# Patient Record
Sex: Male | Born: 2000 | Race: White | Hispanic: No | Marital: Single | Smoking: Never smoker
Health system: Southern US, Community
[De-identification: ages and names within clinical notes are randomized; demographics above are authoritative.]

## PROBLEM LIST (undated history)

## (undated) DIAGNOSIS — F909 Attention-deficit hyperactivity disorder, unspecified type: Secondary | ICD-10-CM

## (undated) DIAGNOSIS — F902 Attention-deficit hyperactivity disorder, combined type: Principal | ICD-10-CM

## (undated) DIAGNOSIS — R278 Other lack of coordination: Secondary | ICD-10-CM

## (undated) HISTORY — PX: PATENT DUCTUS ARTERIOUS REPAIR: SHX269

## (undated) HISTORY — DX: Attention-deficit hyperactivity disorder, combined type: F90.2

## (undated) HISTORY — PX: EYE SURGERY: SHX253

## (undated) HISTORY — DX: Other lack of coordination: R27.8

---
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
## 2022-01-02 ENCOUNTER — Telehealth: Payer: Self-pay | Admitting: Pediatrics

## 2022-01-02 NOTE — Telephone Encounter (Signed)
Called Mom to reschedule an appointment and she stated Brandon House needed a refill on RITALIN) 20 MG .[address] [place] [address]  Phone:  [phone].

## 2022-01-03 MED ORDER — METHYLPHENIDATE HCL 20 MG PO TABS: 20.0000 mg | ORAL_TABLET | Freq: Every day | ORAL | 0 refills | Status: DC | PRN

## 2022-01-03 NOTE — Telephone Encounter (Signed)
RX for above e-scribed and sent to pharmacy on record  [facility] - [place], [place] - [address] [place] [address] Phone: [phone] Fax: [phone]  

## 2022-01-24 ENCOUNTER — Other Ambulatory Visit: Payer: Self-pay

## 2022-01-24 ENCOUNTER — Encounter: Payer: 59 | Admitting: Pediatrics

## 2022-01-24 MED ORDER — METHYLPHENIDATE HCL 20 MG PO TABS: 20.0000 mg | ORAL_TABLET | Freq: Every day | ORAL | 0 refills | Status: DC | PRN

## 2022-01-24 MED ORDER — METHYLPHENIDATE HCL ER (OSM) 54 MG PO TBCR: 54.0000 mg | EXTENDED_RELEASE_TABLET | ORAL | 0 refills | Status: DC

## 2022-01-24 NOTE — Telephone Encounter (Signed)
Concerta 54 mg daily, # 30 with no RF's and Ritalin 20 mg PRN in the evening, # 30 with no RF's .RX for above e-scribed and sent to pharmacy on record ? ?[facility] - [place], [place] - [address] ?[address] ?[place] [address] ?Phone: [phone] Fax: [phone] ? ? ? ?

## 2022-02-02 ENCOUNTER — Encounter: Payer: Self-pay | Admitting: Pediatrics

## 2022-02-02 ENCOUNTER — Telehealth (INDEPENDENT_AMBULATORY_CARE_PROVIDER_SITE_OTHER): Payer: 59 | Admitting: Pediatrics

## 2022-02-02 DIAGNOSIS — Z719 Counseling, unspecified: Secondary | ICD-10-CM

## 2022-02-02 DIAGNOSIS — R278 Other lack of coordination: Secondary | ICD-10-CM

## 2022-02-02 DIAGNOSIS — Z79899 Other long term (current) drug therapy: Secondary | ICD-10-CM

## 2022-02-02 DIAGNOSIS — F902 Attention-deficit hyperactivity disorder, combined type: Secondary | ICD-10-CM

## 2022-02-02 DIAGNOSIS — Z7189 Other specified counseling: Secondary | ICD-10-CM

## 2022-02-02 MED ORDER — METHYLPHENIDATE HCL 20 MG PO TABS: 20.0000 mg | ORAL_TABLET | Freq: Every day | ORAL | 0 refills | Status: DC | PRN

## 2022-02-02 MED ORDER — METHYLPHENIDATE HCL ER (OSM) 54 MG PO TBCR: 54.0000 mg | EXTENDED_RELEASE_TABLET | ORAL | 0 refills | Status: DC

## 2022-02-02 NOTE — Progress Notes (Signed)
?[facility] ?[facility] ?[address] ?[place] [address] ?Dept: [phone] ?Dept Fax: [phone] ? ?Medication Check by Caregility due to COVID-19 ? ?Patient ID:  Jonte Shiller  male DOB: 07/19/2001   21 y.o.   MRN: [id number]  ? ?DATE:02/02/22 ? ?PCP: [name], PA ? ?Interviewed: Marnette Burgess and Father  Name: Holland Falling  ?Location: Their home ?Provider location: [facility] office ? ?Virtual Visit via Video Note ?Connected with Marnette Burgess on 02/02/22 at  2:00 PM EDT by video enabled telemedicine application and verified that I am speaking with the correct person using two identifiers.   ?  ?I discussed the limitations, risks, security and privacy concerns of performing an evaluation and management service by telephone and the availability of in person appointments. I also discussed with the parent/patient that there may be a patient responsible charge related to this service. The parent/patient expressed understanding and agreed to proceed. ? ?HISTORY OF PRESENT ILLNESS/CURRENT STATUS: ?Adel Burch is being followed for medication management for ADHD, dysgraphia and learning differences.   ?Last visit on 10/23/2021 ? ?Tammy currently prescribed Concerta 54 mg every morning with Ritalin 20 mg as needed ? ?Behaviors: Excellent behaviors at home, at school and in work settings ? ?Eating well (eating breakfast, lunch and dinner).  ? ?Elimination: No concerns ? ?Sleeping: No concerns and sleeping through the night.  ? 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 ?[employer] free will do the [employer] - starts in July ?Working at [employer] - usually on weekends ?Screen time:  (phone, tablet, TV, computer): non-essential, no essential ? ?MEDICAL HISTORY: ?Individual Medical History/ Review of Systems: Changes? :No ? ?Family Medical/ Social History: Changes? No   ?Patient Lives with: parents ? ?MENTAL HEALTH: ?Denies sadness, loneliness or depression.  ?Denies self harm or thoughts of self harm or injury. ?Denies fears, worries and anxieties. ?Has good peer relations and is not a bully nor is victimized. ? ? ?ASSESSMENT:  ?Scot is 21-years of age with a diagnosis of ADHD/dysgraphia that is improved and well controlled with current medication.  No medication changes at this time. ?We will plan a 40-month follow-up over the summer and discussed transition of services for college students at that time.  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 ?I do recommend continued screen time reduction and daily physical activities.  Maintain good sleep routines avoiding late nights.  Protein rich food avoiding junk and empty calories. ?Overall stable ADHD with medication management ?Has appropriate school accommodations with progress academically ? ?DIAGNOSES:  ?  ICD-10-CM   ?1. ADHD (attention deficit hyperactivity disorder), combined type  F90.2   ?  ?2. Dysgraphia  R27.8   ?  ?3. Medication management  Z79.899   ?  ?4. Patient counseled  Z71.9   ?  ?5. Parenting dynamics counseling  Z71.89   ?  ? ? ? ?RECOMMENDATIONS:  ?Patient Instructions  ?DISCUSSION: ?Counseled regarding the following coordination of care items: ? ?Continue medication as directed ?Concerta 54 mg every morning ?Ritalin 20 mg as needed for evening activities ? ?RX for above e-scribed and sent to pharmacy on record ? ?[facility] - [place], [place] -  [address] ?[address] ?[place] [address] ?Phone: [phone] Fax: [phone] ? ?Advised importance of:  ?Sleep ?Maintain good sleep routines avoiding late nights ?Limited screen time (none on school nights, no more  than 2 hours on weekends) ?Continue screen time reduction ?Regular exercise(outside and active play) ?Daily physical activities with skill building ?Healthy eating (drink water, no sodas/sweet tea) ?Protein rich avoiding junk and empty calories ? ?Additional resources for parents: ? ?Child Mind Institute - https://childmind.org/ ?ADDitude Magazine ThirdIncome.ca  ? ? ? ? ? ? ?NEXT APPOINTMENT:  ?Return in about 4 months (around 06/04/2022) for Medical Follow up. ?Please call the office for a sooner appointment if problems arise. ? ?Medical Decision-making: ? ?I spent 20 minutes dedicated to the care of this patient on the date of this encounter to include face to face time with the patient and/or parent reviewing medical records and documentation by teachers, performing and discussing the assessment and treatment plan, reviewing and explaining completed speciality labs and obtaining specialty lab samples. ? ?The patient and/or parent was provided an opportunity to ask questions and all were answered. The patient and/or parent agreed with the plan and demonstrated an understanding of the instructions. ?  ?The patient and/or parent was advised to call back or seek an in-person evaluation if the symptoms worsen or if the condition fails to improve as anticipated. ? ?I provided 20 minutes of non-face-to-face time during this encounter.   ?Completed record review for 5 minutes prior to and after the virtual visit.  ? ?Disclaimer: This documentation was generated through the use of dictation and/or voice recognition software, and as such, may contain spelling or other transcription errors. Please disregard any inconsequential errors.  Any questions regarding the content of this documentation should be directed to the individual who electronically signed. ? ? ?

## 2022-02-02 NOTE — Patient Instructions (Addendum)
DISCUSSION: ?Counseled regarding the following coordination of care items: ? ?Continue medication as directed ?Concerta 54 mg every morning ?Ritalin 20 mg as needed for evening activities ? ?RX for above e-scribed and sent to pharmacy on record ? ?[facility] - [place], [place] - [address] ?[address] ?[place] [address] ?Phone: [phone] Fax: [phone] ? ?Advised importance of:  ?Sleep ?Maintain good sleep routines avoiding late nights ?Limited screen time (none on school nights, no more than 2 hours on weekends) ?Continue screen time reduction ?Regular exercise(outside and active play) ?Daily physical activities with skill building ?Healthy eating (drink water, no sodas/sweet tea) ?Protein rich avoiding junk and empty calories ? ?Additional resources for parents: ? ?Child Mind Institute - https://childmind.org/ ?ADDitude Magazine ThirdIncome.ca  ? ? ? ? ?

## 2022-02-22 ENCOUNTER — Other Ambulatory Visit: Payer: Self-pay

## 2022-02-22 MED ORDER — METHYLPHENIDATE HCL 20 MG PO TABS: 20.0000 mg | ORAL_TABLET | Freq: Every day | ORAL | 0 refills | Status: DC | PRN

## 2022-02-22 MED ORDER — METHYLPHENIDATE HCL ER (OSM) 54 MG PO TBCR: 54.0000 mg | EXTENDED_RELEASE_TABLET | ORAL | 0 refills | Status: DC

## 2022-02-22 NOTE — Telephone Encounter (Signed)
Concerta 54 mg daily# 30 with no RF's and Ritalin 20 mg daily, # 30 with no RF's. Post dated for 03/02/2022.Marland KitchenRX for above e-scribed and sent to pharmacy on record ? ?[facility] - [place], [place] - [address] ?[address] ?[place] [address] ?Phone: [phone] Fax: [phone] ? ? ? ? ? ?

## 2022-03-26 ENCOUNTER — Other Ambulatory Visit: Payer: Self-pay

## 2022-03-26 MED ORDER — METHYLPHENIDATE HCL 20 MG PO TABS: 20.0000 mg | ORAL_TABLET | Freq: Every day | ORAL | 0 refills | Status: DC | PRN

## 2022-03-26 MED ORDER — METHYLPHENIDATE HCL ER (OSM) 54 MG PO TBCR: 54.0000 mg | EXTENDED_RELEASE_TABLET | ORAL | 0 refills | Status: DC

## 2022-03-26 NOTE — Telephone Encounter (Signed)
RX for above e-scribed and sent to pharmacy on record  [facility] [place] - [address] [place] [address] Phone: [phone] Fax: [phone]

## 2022-04-25 ENCOUNTER — Other Ambulatory Visit: Payer: Self-pay

## 2022-04-25 MED ORDER — METHYLPHENIDATE HCL ER (OSM) 54 MG PO TBCR: 54.0000 mg | EXTENDED_RELEASE_TABLET | ORAL | 0 refills | Status: DC

## 2022-04-25 NOTE — Telephone Encounter (Signed)
RX for above e-scribed and sent to pharmacy on record  [facility] - [place], [place] - [address] [place] [address] Phone: [phone] Fax: [phone]  

## 2022-05-11 ENCOUNTER — Institutional Professional Consult (permissible substitution): Payer: 59 | Admitting: Pediatrics

## 2022-05-14 ENCOUNTER — Ambulatory Visit (INDEPENDENT_AMBULATORY_CARE_PROVIDER_SITE_OTHER): Payer: 59 | Admitting: Nurse Practitioner

## 2022-05-14 ENCOUNTER — Encounter: Payer: Self-pay | Admitting: Nurse Practitioner

## 2022-05-14 VITALS — BP 104/68 | HR 68 | Ht 69.5 in | Wt 146.4 lb

## 2022-05-14 DIAGNOSIS — Z79899 Other long term (current) drug therapy: Secondary | ICD-10-CM | POA: Diagnosis not present

## 2022-05-14 DIAGNOSIS — Z719 Counseling, unspecified: Secondary | ICD-10-CM

## 2022-05-14 DIAGNOSIS — F902 Attention-deficit hyperactivity disorder, combined type: Secondary | ICD-10-CM

## 2022-05-14 NOTE — Progress Notes (Signed)
[facility] [address] [place] [address] Dept: [phone] Dept Fax: [phone] Loc: [phone] Loc Fax: [phone]    Medication Check  Patient ID: Brandon House  DOB: [birth year]  MRN: [id number]  DATE: Brandon Bees, PA  Accompanied by: Father   Initial intake and eval:  10/17/2012 and 10/28/2012, respectively Last office visit: 02/02/2022  Current Outpatient Medications:    cetirizine (ZYRTEC) 10 MG tablet p.o. daily during seasonal allergies   methylphenidate (RITALIN) 20 MG tablet, Take 1 tablet (20 mg total) by mouth daily as needed (evening activities)   methylphenidate 54 MG PO CR tablet, Take 1 tablet (54 mg total) by mouth every morning   HISTORY/CURRENT STATUS: Brandon House has been followed by [facility] for many years for ADHD, combined type and medication management for s/s of ADHD. He has been on the same dose of methylphenidate since 2020. He presents to office today for routine medication check.  Patient and father (patient chose to have father present during visit) both endorse that current med regimen is working well to treat s/s of ADHD.  He takes Concerta 54 mg p.o. Q am every day and then ritalin 20 mg p.o. in late afternoon as needed for chores/work/projects when long-acting methylphenidate has worn off.  Brandon House just completed high school and graduated with several honors. 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  He has no concerns today.  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, 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;  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:  Monday night, Thursday, Friday, Saturday  THERAPIES: NO therapies currently History of: Speech Therapy: for articulation and language delay via [employer] OT/PTOther (Tutoring, Counseling):  received extra academic help in elementary school  SOCIAL: Living arrangements: mother, father, and sister (when she is home from college; she is a Holiday representative at [employer]) Family:  gets along with family very well Peers: has a nice group of friends; four good friends will be attending the Dietitian with him Activities/interests:  as above, plays soccer, 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  MENTAL HEALTH: Both father and Brandon House deny any s/s of depression or sadness or any concerns for anxiety  Negative PHQ- 2 today  REVIEW OF SYSTEMS Energy:"pretty good" CV:  no dizziness, fainting NOTE: has history of titanium piece for heart valve (~ 21 years of age; no longer followed by cardiology-completely cleared) GI:  no stomachaches, n/v    Appetite: very good appetite   GU: voiding without difficulty Sleep:  not problematic; sleeps well    Bedtime: 2100-220 0     Onset: within minutes    Duration:  sleeps straight though    Awakens:  0700-0800 Neuro:  No h/a Overall mood:  "happy"  Changes in individual medical history:  :No  Changes in family medical/social history:  Yes as above, 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  General Physical Exam: Physical Exam Vitals reviewed. Exam conducted with a  chaperone present.  Constitutional:      Appearance: Normal appearance. He is normal weight.  HENT:     Head: Normocephalic and atraumatic.  Eyes:     Conjunctiva/sclera: Conjunctivae normal.  Cardiovascular:     Rate and Rhythm: Normal rate and regular rhythm.     Heart sounds: Normal heart sounds.  Pulmonary:     Effort: Pulmonary effort is normal.     Breath sounds: Normal breath sounds.  Musculoskeletal:        General: Normal range of motion.     Cervical back: Normal range of motion and neck supple.  Skin:    General: Skin is warm and dry.  Neurological:     General: No focal deficit present.     Mental Status: He is alert and oriented to person, place, and time.  Psychiatric:        Mood and Affect: Mood normal.        Behavior: Behavior normal.        Thought Content: Thought content normal.        Judgment: Judgment normal.     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 Q am- No Rx needed today; filled ~ 2 weeks ago  -Continue Ritalin 20 mg p.o. between 1500 and 1700 as needed for projects/work in late afternoon after Concerta has worn off No Rx needed today  We will follow up either via phone call or email in 3 months to ensure he is doing well on medication regimen Next medication appt in 6 months in January- patient to call back to make appt as he does not know his schedule for January yet  DIAGNOSES:    ICD-10-CM   1. ADHD (attention deficit hyperactivity disorder), combined type  F90.2     2. Medication  management  Z79.899     3. Patient counseled  Z71.9       RECOMMENDATIONS:  Patient Instructions  Continue Concerta 54 mg by mouth every morning Take Ritalin 20 mg by mouth as needed in the late afternoon 3 month prescription to be send on 7/20 Followup via email or phone call in 24months Then in person  January  Patient verbalized understanding of all topics discussed.  NEXT APPOINTMENT:  Return in about 6 months (around 11/14/2022) for Medication Check. Also follow up via email or phone call in 3 months Prn for any questions or concerns  Patient is new to this provider Face to face time:  30 minutes Collecting interim history:   20 minutes Plan of care discussion:  10 minutes

## 2022-05-14 NOTE — Patient Instructions (Signed)
Continue Concerta 54 mg by mouth every morning Take Ritalin 20 mg by mouth as needed in the late afternoon 3 month prescription to be send on 7/20 Followup via email or phone call in 36months Then in person  January

## 2022-05-23 ENCOUNTER — Telehealth: Payer: Self-pay | Admitting: Nurse Practitioner

## 2022-05-24 MED ORDER — METHYLPHENIDATE HCL ER (OSM) 54 MG PO TBCR: 54.0000 mg | EXTENDED_RELEASE_TABLET | ORAL | 0 refills | Status: DC

## 2022-05-24 MED ORDER — METHYLPHENIDATE HCL 20 MG PO TABS: 20.0000 mg | ORAL_TABLET | Freq: Every day | ORAL | 0 refills | Status: DC | PRN

## 2022-05-24 NOTE — Telephone Encounter (Signed)
RX for above e-scribed and sent to pharmacy on record  [facility] - [place], [place] - [address] [place] [address] Phone: [phone] Fax: [phone]  

## 2022-06-25 ENCOUNTER — Telehealth: Payer: Self-pay | Admitting: Nurse Practitioner

## 2022-06-25 MED ORDER — METHYLPHENIDATE HCL 20 MG PO TABS: ORAL_TABLET | ORAL | 0 refills | Status: DC

## 2022-06-25 MED ORDER — METHYLPHENIDATE HCL ER (OSM) 54 MG PO TBCR: 54.0000 mg | EXTENDED_RELEASE_TABLET | Freq: Every morning | ORAL | 0 refills | Status: DC

## 2022-06-25 NOTE — Telephone Encounter (Signed)
Concerta 54 mg p.o. Q am  Disp:  30  Refill: 0 Ritalin 20 mg p.o. between 3 and 5 pm prn for projects and tasks in late afternoon when Concerta has worn off Disp: # 30 Refill:  0  RX for above e-scribed and sent to pharmacy on record  [facility] [place], [place] - [address] [place] [address] Phone: [phone] Fax: [phone]

## 2022-08-27 ENCOUNTER — Other Ambulatory Visit: Payer: Self-pay

## 2022-08-27 MED ORDER — METHYLPHENIDATE HCL ER (OSM) 54 MG PO TBCR: 54.0000 mg | EXTENDED_RELEASE_TABLET | Freq: Every morning | ORAL | 0 refills | Status: DC

## 2022-08-27 MED ORDER — METHYLPHENIDATE HCL 20 MG PO TABS: ORAL_TABLET | ORAL | 0 refills | Status: DC

## 2022-08-27 NOTE — Telephone Encounter (Signed)
RX for above e-scribed and sent to pharmacy on record  [facility] - [place], [place] - [address] [place]  [address] Phone: [phone] Fax: [phone]  

## 2022-09-25 ENCOUNTER — Other Ambulatory Visit: Payer: Self-pay

## 2022-09-25 MED ORDER — METHYLPHENIDATE HCL ER (OSM) 54 MG PO TBCR: 54.0000 mg | EXTENDED_RELEASE_TABLET | Freq: Every morning | ORAL | 0 refills | Status: DC

## 2022-09-25 MED ORDER — METHYLPHENIDATE HCL 20 MG PO TABS: ORAL_TABLET | ORAL | 0 refills | Status: DC

## 2022-09-25 NOTE — Telephone Encounter (Signed)
RX for above e-scribed and sent to pharmacy on record  [facility] - [place], [place] - [address] [place] [address] Phone: [phone] Fax: [phone]  

## 2022-10-30 ENCOUNTER — Other Ambulatory Visit: Payer: Self-pay

## 2022-10-30 MED ORDER — METHYLPHENIDATE HCL 20 MG PO TABS: ORAL_TABLET | ORAL | 0 refills | Status: DC

## 2022-10-30 MED ORDER — METHYLPHENIDATE HCL ER (OSM) 54 MG PO TBCR: 54.0000 mg | EXTENDED_RELEASE_TABLET | Freq: Every morning | ORAL | 0 refills | Status: DC

## 2022-10-30 NOTE — Telephone Encounter (Signed)
Concerta 54 mg daily, #30 with no RF's and Ritalin 20 mg in the afternoon, #30 with no RF's.RX for above e-scribed and sent to pharmacy on record  [facility] [place] - [address] [place] [address] Phone: [phone] Fax: [phone]

## 2022-11-14 ENCOUNTER — Encounter: Payer: Self-pay | Admitting: Pediatrics

## 2022-11-15 ENCOUNTER — Other Ambulatory Visit: Payer: Self-pay | Admitting: Pediatrics

## 2022-11-15 MED ORDER — METHYLPHENIDATE HCL 20 MG PO TABS: ORAL_TABLET | ORAL | 0 refills | Status: DC

## 2022-11-15 MED ORDER — METHYLPHENIDATE HCL ER (OSM) 54 MG PO TBCR: 54.0000 mg | EXTENDED_RELEASE_TABLET | Freq: Every morning | ORAL | 0 refills | Status: DC

## 2022-11-15 NOTE — Telephone Encounter (Signed)
RX for above e-scribed and sent to pharmacy on record  [facility] [place] [place] [address] Phone: [phone] Fax: [phone]

## 2022-12-12 ENCOUNTER — Telehealth: Payer: Self-pay | Admitting: Pediatrics

## 2022-12-12 MED ORDER — METHYLPHENIDATE HCL ER (OSM) 54 MG PO TBCR: 54.0000 mg | EXTENDED_RELEASE_TABLET | Freq: Every morning | ORAL | 0 refills | Status: AC

## 2022-12-12 MED ORDER — METHYLPHENIDATE HCL 20 MG PO TABS: 20.0000 mg | ORAL_TABLET | Freq: Every day | ORAL | 0 refills | Status: AC | PRN

## 2022-12-12 NOTE — Telephone Encounter (Signed)
RX for above e-scribed and sent to pharmacy on record  [facility] [place] [place] [address] Phone: [phone] Fax: [phone]
